# Patient Record
Sex: Female | Born: 1970 | Race: Black or African American | Hispanic: No | Marital: Single
Health system: Midwestern US, Community
[De-identification: ages and names within clinical notes are randomized; demographics above are authoritative.]

## PROBLEM LIST (undated history)

## (undated) DIAGNOSIS — E119 Type 2 diabetes mellitus without complications: Secondary | ICD-10-CM

## (undated) DIAGNOSIS — F319 Bipolar disorder, unspecified: Secondary | ICD-10-CM

## (undated) DIAGNOSIS — R7989 Other specified abnormal findings of blood chemistry: Secondary | ICD-10-CM

## (undated) DIAGNOSIS — E1165 Type 2 diabetes mellitus with hyperglycemia: Secondary | ICD-10-CM

## (undated) DIAGNOSIS — M25569 Pain in unspecified knee: Secondary | ICD-10-CM

## (undated) DIAGNOSIS — J209 Acute bronchitis, unspecified: Secondary | ICD-10-CM

## (undated) HISTORY — PX: BACK SURGERY: SHX140

## (undated) HISTORY — PX: LITHOTRIPSY: SUR834

---

## 2016-05-29 ENCOUNTER — Emergency Department: Payer: Medicaid Other

## 2016-05-29 ENCOUNTER — Emergency Department
Admission: EM | Admit: 2016-05-29 | Discharge: 2016-05-29 | Disposition: A | Discharge status: Home / Self Care | Payer: Medicaid Other | Attending: Emergency Medicine | Admitting: Emergency Medicine

## 2016-05-29 ENCOUNTER — Encounter: Payer: Self-pay | Admitting: *Deleted

## 2016-05-29 DIAGNOSIS — M25561 Pain in right knee: Secondary | ICD-10-CM | POA: Diagnosis not present

## 2016-05-29 DIAGNOSIS — E119 Type 2 diabetes mellitus without complications: Secondary | ICD-10-CM | POA: Insufficient documentation

## 2016-05-29 HISTORY — DX: Bipolar disorder, unspecified: F31.9

## 2016-05-29 HISTORY — DX: Type 2 diabetes mellitus without complications: E11.9

## 2016-05-29 MED ORDER — MELOXICAM 15 MG PO TABS: 15.0000 mg | ORAL_TABLET | Freq: Every day | ORAL | 0 refills | Status: AC

## 2016-05-29 MED ORDER — BACLOFEN 10 MG PO TABS: 10.0000 mg | ORAL_TABLET | Freq: Three times a day (TID) | ORAL | 0 refills | Status: AC

## 2016-05-29 MED ORDER — MELOXICAM 7.5 MG PO TABS
15.0000 mg | ORAL_TABLET | Freq: Once | ORAL | Status: AC
  Administered 2016-05-29: 15 mg via ORAL
  Filled 2016-05-29: qty 2

## 2016-05-29 MED ORDER — MELOXICAM 15 MG PO TABS: 15.0000 mg | ORAL_TABLET | Freq: Every day | ORAL | 0 refills | Status: DC

## 2016-05-29 MED ORDER — BACLOFEN 10 MG PO TABS
10.0000 mg | ORAL_TABLET | Freq: Three times a day (TID) | ORAL | 0 refills | Status: DC
Start: 2016-05-29 — End: 2016-05-29

## 2016-05-29 NOTE — ED Provider Notes (Signed)
[facility] Emergency Department Provider Note ____________________________________________  Time seen: Approximately 9:35 PM  I have reviewed the triage vital signs and the nursing notes.   HISTORY  Chief Complaint Knee Pain    HPI Yvonne Mccormick is a 46 y.o. female presents to the emergency department for evaluation of right knee pain. She states she has a history of 2 surgeries to the right knee. She states that while in church this morning, her knee began to hurt and she felt it "pop." She states that while walking home it continued to hurt and she felt like it got weak and was going to "give up on her." She has not taken any medications for her pain. She states that she went home and took a nap and when she woke up it was still hurting so she decided to call the ambulance to bring her to the hospital. She denies injury. She denies recent immobilization. She denies any chest pain or shortness of breath. She denies any pain in her right calf. She is not currently taking any hormones.She denies smoking.  Past Medical History:  Diagnosis Date  . Bipolar 1 disorder (HCC)   . Diabetes mellitus without complication (HCC)     There are no active problems to display for this patient.   Past Surgical History:  Procedure Laterality Date  . BACK SURGERY    . LITHOTRIPSY      Prior to Admission medications   Medication Sig Start Date End Date Taking? Authorizing Provider  baclofen (LIORESAL) 10 MG tablet Take 1 tablet (10 mg total) by mouth 3 (three) times daily. 05/29/16   Chinita Pester, FNP  meloxicam (MOBIC) 15 MG tablet Take 1 tablet (15 mg total) by mouth daily. 05/29/16   Chinita Pester, FNP    Allergies Lamotrigine and Paliperidone  No family history on file.  Social History Social History  Substance Use Topics  . Smoking status: Not on file  . Smokeless tobacco: Not on file  . Alcohol use Not on file    Review of Systems Constitutional: No  recent illness. Cardiovascular: Denies chest pain or palpitations. Respiratory: Denies shortness of breath. Musculoskeletal: Pain in Right knee Skin: Negative for rash, wound, lesion. Neurological: Negative for focal weakness or numbness.  ____________________________________________   PHYSICAL EXAM:  VITAL SIGNS: ED Triage Vitals  Enc Vitals Group     BP 05/29/16 2053 129/70     Pulse Rate 05/29/16 2052 98     Resp 05/29/16 2052 18     Temp 05/29/16 2052 98.3 F (36.8 C)     Temp Source 05/29/16 2052 Axillary     SpO2 05/29/16 2052 96 %     Weight 05/29/16 2050 246 lb (111.6 kg)     Height 05/29/16 2050  (1.6 m)     Head Circumference --      Peak Flow --      Pain Score 05/29/16 2050 9     Pain Loc --      Pain Edu? --      Excl. in GC? --     Constitutional: Alert and oriented. Well appearing and in no acute distress. Eyes: Conjunctivae are normal. EOMI. Head: Atraumatic. Neck: No stridor.  Respiratory: Normal respiratory effort.   Musculoskeletal: Right knee without any evidence of acute abnormality. Tenderness is worse on the medial aspect. There is no swelling or erythema. Patient noted to be actively moving the knee when describing the pain, but when requested  to move the knee states it is too painful. Neurologic:  Normal speech and language. No gross focal neurologic deficits are appreciated. Speech is normal. No gait instability. Skin:  Skin is warm, dry and intact. Atraumatic. Psychiatric: Mood and affect are normal. Speech and behavior are normal.  ____________________________________________   LABS (all labs ordered are listed, but only abnormal results are displayed)  Labs Reviewed - No data to display ____________________________________________  RADIOLOGY  Right knee negative for acute abnormality per radiology. I, Kem Boroughs, personally viewed and evaluated these images (plain radiographs) as part of my medical decision making, as well as  reviewing the written report by the radiologist. ___________________________________________   PROCEDURES  Procedure(s) performed: Knee immobilizer applied to the right knee about are in. Patient neurovascularly intact post-application.  ____________________________________________   INITIAL IMPRESSION / ASSESSMENT AND PLAN / ED COURSE  46 year old female presenting to the emergency department for evaluation of right knee pain. Exam and x-ray are both consistent. No acute abnormality is noted. Patient was instructed to follow-up with orthopedics for symptoms that are not improving over the next few days. She was advised to wear the knee immobilizer when out of bed as long as the knee is painful or until she is advised to do otherwise by orthopedics.   Pertinent labs & imaging results that were available during my care of the patient were reviewed by me and considered in my medical decision making (see chart for details).  _________________________________________   FINAL CLINICAL IMPRESSION(S) / ED DIAGNOSES  Final diagnoses:  Acute pain of right knee    Discharge Medication List as of 05/29/2016 10:19 PM    START taking these medications   Details  baclofen (LIORESAL) 10 MG tablet Take 1 tablet (10 mg total) by mouth 3 (three) times daily., Starting Sun 05/29/2016, Print    meloxicam (MOBIC) 15 MG tablet Take 1 tablet (15 mg total) by mouth daily., Starting Sun 05/29/2016, Print        If controlled substance prescribed during this visit, 12 month history viewed on the [facility] prior to issuing an initial prescription for Schedule II or III opiod.    Chinita Pester, FNP 05/29/16 2312    Sharyn Creamer, MD 05/29/16 2356

## 2016-05-29 NOTE — Discharge Instructions (Signed)
Follow up with primary care or orthopedics. Use the knee immobilizer when you are out of bed. Take the medications as prescribed.

## 2016-05-29 NOTE — ED Notes (Signed)
Heading to flex wait area to get this patient for treatment room when I hear someone on the other side of the door shouting curse words; I enter the waiting area and pt is on her cell phone shouting at someone; there are several other people in the waiting area and pt is asked to please refrain from such language; pt got angry and said she had already apologized to them and "you need to go on"; pt became pleasant and apologetic when she realized she was going to a treatment room; assisted to treatment room 43 and onto stretcher without incident

## 2016-05-29 NOTE — ED Triage Notes (Signed)
Pt arrives via EMS from home. Pt states she woke up from a nap today and has had right knee pain, states she is unable to stand up on the leg. She denies any injury. Has had orthoscopic surgery on the knee previously. No deformity or swelling noted.

## 2018-10-06 IMAGING — CR DG KNEE COMPLETE 4+V*R*
1 series · 4 of 4 positions shown · non-contrast
Comparison: None.

CLINICAL DATA: Patient woke up today with right knee pain and
unable to stand. No injury. Previous right knee surgery.

EXAM:
RIGHT KNEE - COMPLETE 4+ VIEW

[Series 1: dg knee complete 4 views right · 0.14mm/px · 4 of 4 slices shown]
[im 1/4]
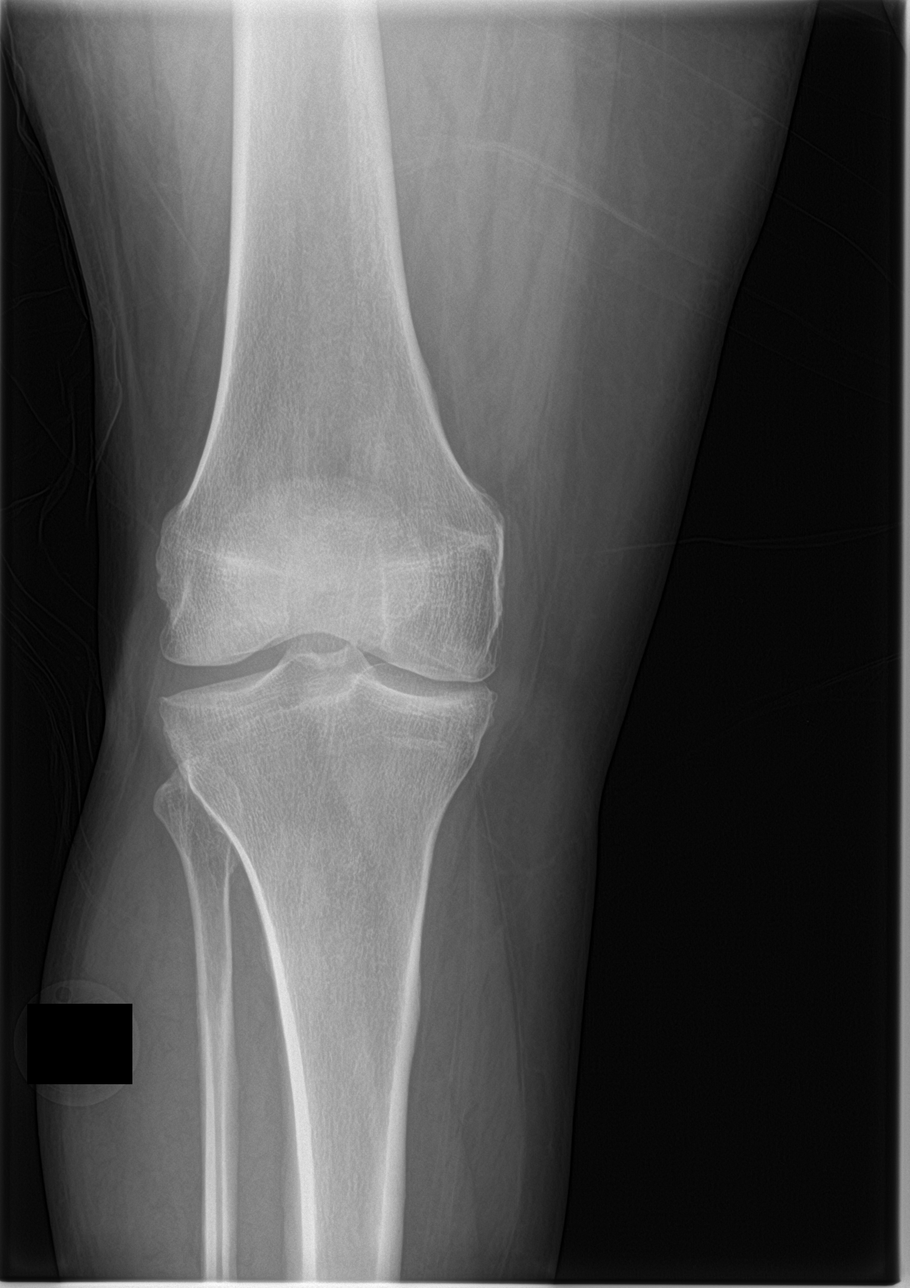
[im 2/4]
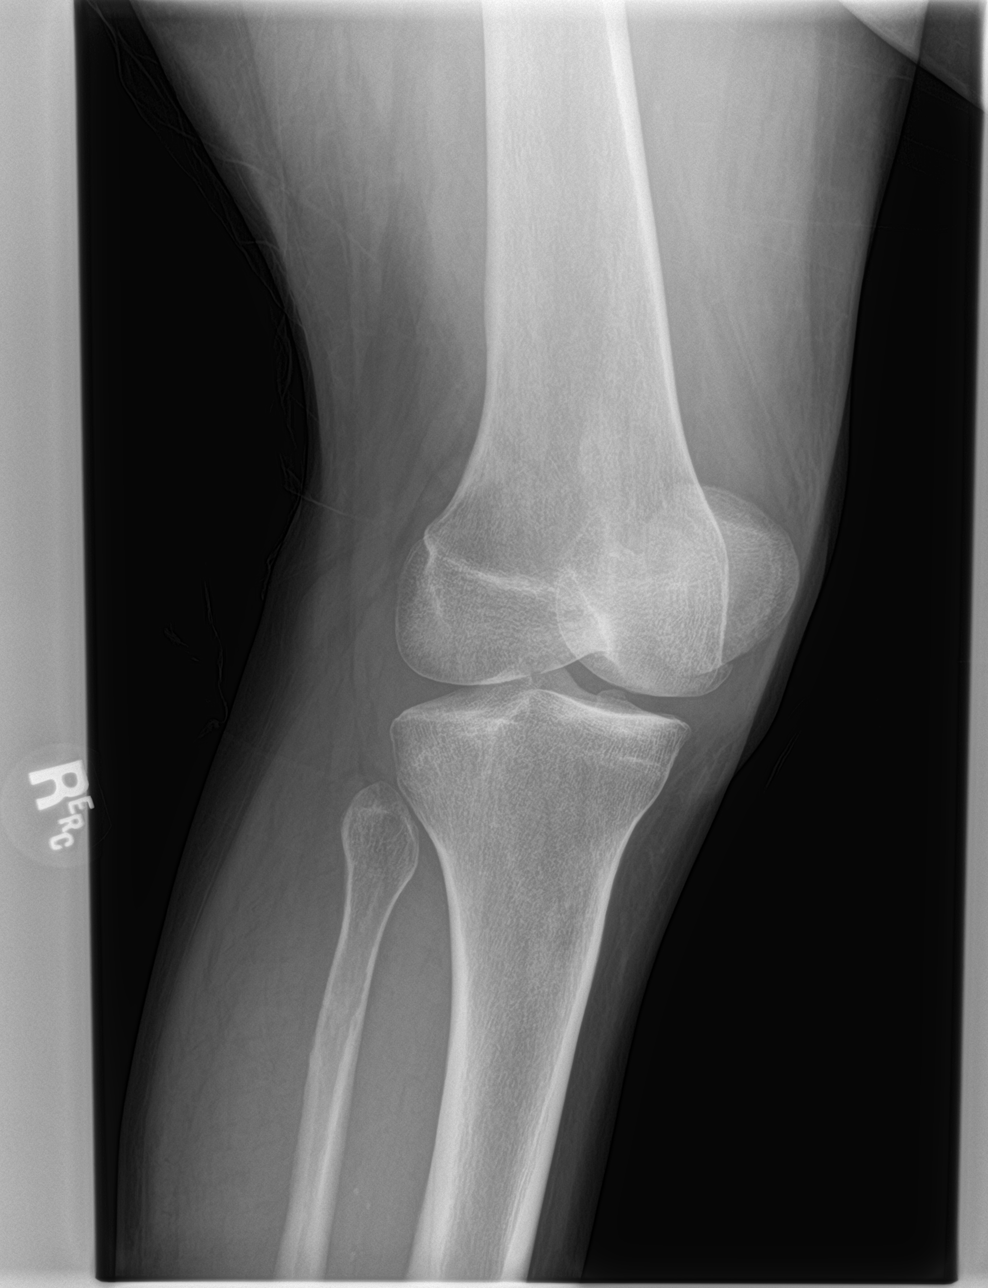
[im 3/4]
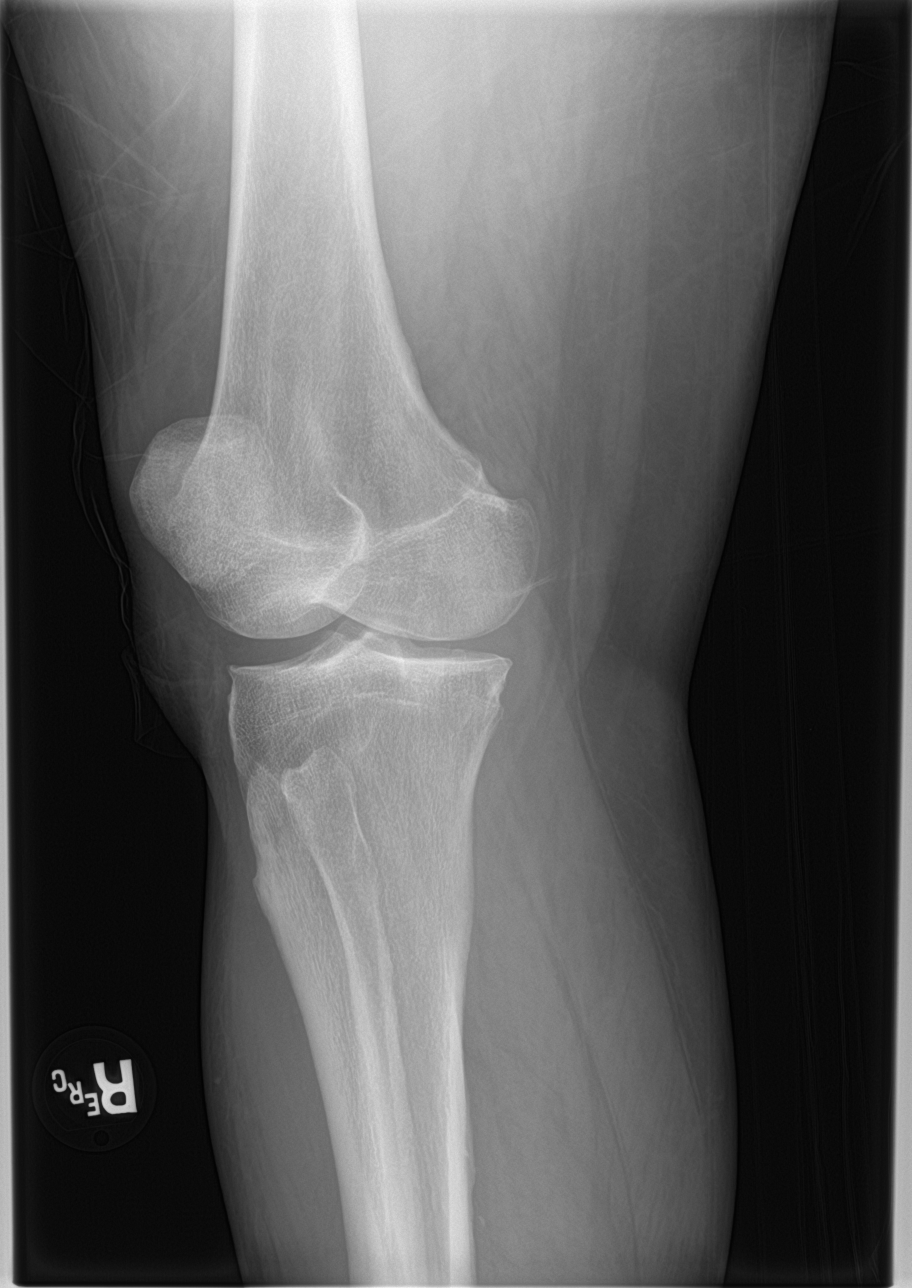
[im 4/4]
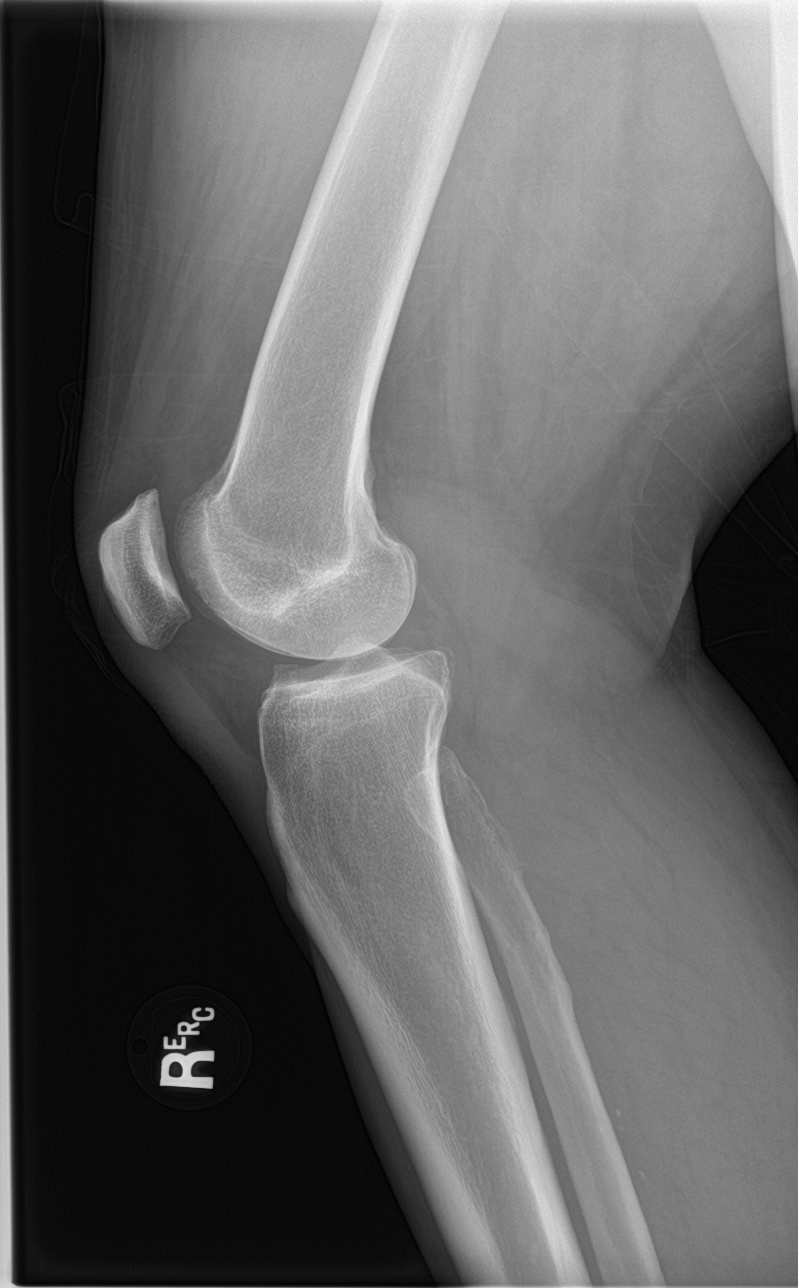

[4 of 4 positions shown; findings below may reference images not displayed]

FINDINGS: Minimal degenerative changes over the medial compartment. No acute
fracture or dislocation. No significant joint effusion.
IMPRESSION: No acute findings.

## 2019-11-06 ENCOUNTER — Ambulatory Visit: Attending: Family | Primary: Family Medicine

## 2020-02-19 ENCOUNTER — Encounter: Payer: PRIVATE HEALTH INSURANCE | Attending: Family Medicine | Primary: Family Medicine

## 2020-02-25 ENCOUNTER — Ambulatory Visit
Admit: 2020-02-25 | Discharge: 2020-02-25 | Payer: PRIVATE HEALTH INSURANCE | Attending: Family Medicine | Primary: Family Medicine

## 2020-02-25 ENCOUNTER — Ambulatory Visit: Attending: Family Medicine | Primary: Family Medicine

## 2020-02-25 DIAGNOSIS — Z0000 Encounter for general adult medical examination without abnormal findings: Secondary | ICD-10-CM

## 2020-02-25 MED ORDER — MELOXICAM 15 MG TAB
15 mg | ORAL_TABLET | Freq: Every day | ORAL | 1 refills | Status: AC
Start: 2020-02-25 — End: ?

## 2020-02-25 MED ORDER — LISINOPRIL 10 MG TAB
10 mg | ORAL_TABLET | Freq: Every day | ORAL | 3 refills | Status: AC
Start: 2020-02-25 — End: ?

## 2020-02-25 MED ORDER — GLIPIZIDE 10 MG TAB
10 mg | ORAL_TABLET | Freq: Every day | ORAL | 1 refills | Status: DC
Start: 2020-02-25 — End: 2020-06-01

## 2020-02-25 MED ORDER — METFORMIN 500 MG TAB
500 mg | ORAL_TABLET | Freq: Two times a day (BID) | ORAL | 0 refills | Status: AC
Start: 2020-02-25 — End: 2020-05-25

## 2020-02-25 NOTE — Progress Notes (Signed)
Chief Complaint   Patient presents with   ??? Complete Physical     she is a 50 y.o. year old female who presents for CPE  Complete Physical Exam Questions:    LMP -  02/05/2020  Last Pap (q 3 years, or q5 with HPV) - 12/2019  Last Mammogram (50-74 biennially)- 11/21  Hx of abnl Pap - No    1.  Do you follow a low fat diet?  no  2.  Are you up to date on your Tdap (<10 years)?  Unknown  3.  Have you ever had a Pneumovax vaccine (>65)?  Not applicable   KYH06 Not applicable   CBJS28 Not applicable  4.  Have you had Zoster vaccine (>60)? Not applicable  5.  Have you had the HPV - Gardasil (13- 26)? Not applicable  6.  Do you follow an exercise program?  no  7.  Do you smoke?  yes  8.  Do you consider yourself overweight?  yes  9.  Is there a family history of CAD< age 66? No  10.  Is there a family history of Cancer?  yes  11.  Do you know your Cancer risks?  Yes  12.  Have you had a colonoscopy?  no  13. Have you been tested for HIV or other STI's? Yes HIV testing today(18-65 y/o)?Yes  14.  Have had a bone density scan or DEXA done(>65)?Not applicable  15.  Have you had an EKG performed in the last five years (>50)?  No    Other complaints:    Reviewed and agree with Nurse Note and duplicated in this note.  Reviewed PmHx, RxHx, FmHx, SocHx, AllgHx and updated and dated in the chart.    No family history on file.    Past Medical History:   Diagnosis Date   ??? Diabetes [facility])       Social History     Socioeconomic History   ??? Marital status: SINGLE        Review of Systems - negative except as listed above      Objective:     Vitals:    02/25/20 1609   BP: 120/79   Pulse: 98   Resp: 16   Temp: 98.4 ??F (36.9 ??C)   SpO2: 96%   Weight: 260 lb (117.9 kg)   Height: 5\' 3"  (1.6 m)       Physical Examination: General appearance - alert, well appearing, and in no distress  Eyes - pupils equal and reactive, extraocular eye movements intact  Ears - bilateral TM's and external ear canals normal  Nose - normal and patent, no erythema,  discharge or polyps  Mouth - mucous membranes moist, pharynx normal without lesions  Neck - supple, no significant adenopathy  Chest - clear to auscultation, no wheezes, rales or rhonchi, symmetric air entry  Heart - normal rate, regular rhythm, normal S1, S2, no murmurs, rubs, clicks or gallops  Abdomen - soft, nontender, nondistended, no masses or organomegaly  Neurological - alert, oriented, normal speech, no focal findings or movement disorder noted  Musculoskeletal - no joint tenderness, deformity or swelling  Extremities - peripheral pulses normal, no pedal edema, no clubbing or cyanosis  Skin - normal coloration and turgor, no rashes, no suspicious skin lesions noted      Assessment/ Plan:   Diagnoses and all orders for this visit:    1. Well woman exam (no gynecological exam)  -     CBC  W/O DIFF; Future  -     METABOLIC PANEL, COMPREHENSIVE; Future  -     LIPID PANEL; Future    2. Need for hepatitis C screening test  -     HEPATITIS C AB; Future    3. Possible exposure to STD  -     HSV 1 AND 2-SPEC AB, IGG W/RFX TO SUPPL HSV-2 TESTING; Future  -     HIV 1/2 AG/AB, 4TH GENERATION,W RFLX CONFIRM; Future  -     RPR; Future  -     CHLAMYDIA / GC-AMPLIFIED; Future    4. Controlled type 2 diabetes mellitus with complication, without long-term current use of insulin (HCC)  -     HEMOGLOBIN A1C WITH EAG; Future  -     MICROALBUMIN, UR, RAND W/ MICROALB/CREAT RATIO; Future  -     HM DIABETES FOOT EXAM    5. Colon cancer screening  -     REFERRAL TO GASTROENTEROLOGY    6. Dietary counseling and surveillance  -     REFERRAL TO WEIGHT LOSS    7. BMI 45.0-49.9, adult (HCC)  -     REFERRAL TO WEIGHT LOSS           Labs to be drawn: CBC, CMP, Lipid            I have discussed the diagnosis with the patient and the intended plan as seen in the above orders.  The patient has received an after-visit summary and questions were answered concerning future plans.   Medication Side Effects and Warnings were discussed with  patient,  Patient Labs were reviewed and or requested, and  Patient Past Records were reviewed and or requested  yes         Chief Complaint   Patient presents with   ??? Complete Physical     she is a 50 y.o. year old female who presents for follow-up of DM and HTN    Diabetes - Pt well controlled on Metformin glipizide.      is not checking BS at home.  denies hypoglycemia symptoms.  denies neuropathy symptoms.  Last eye exam none.  Last foot exam none.    Questionaire:  Diabetes Report Card   1) Have you seen the eye doctor in past year?no    2) How would you  rate your Diabetic Diet?  Good   3) How well do you take care of your feet?  Well   4) Do you keep your Primary Care Follow Up Appts?no    5) Do you know your A1C goal?yes    6) Do you take your medications daily?yes    7) Do you check your blood sugars?no    8) Have you gained weight?yes    9) Have you lost weight?no    10) Do you follow an exercise program?no    11) Can you do better?yes        Patient would also like referral to be seen by weight loss clinic.  Patient has not tried any weight loss medications or formalized therapy or nutritional .    No results found for: HBA1C, HBA1CEXT  No results found for: CHOL, CHOLX, CHLST, CHOLV, HDL, HDLP, LDL, LDLC, DLDLP, TGLX, TRIGL, TRIGP, CHHD, CHHDX  No results found for: MCACR, MCA1, MCA2, MCA3, MCAU      Reviewed and agree with Nurse Note and duplicated in this note.  Reviewed PmHx, RxHx, FmHx, SocHx, AllgHx and updated and dated  in the chart.    History reviewed. No pertinent family history.    Past Medical History:   Diagnosis Date   ??? Diabetes [facility])       Social History     Socioeconomic History   ??? Marital status: SINGLE   Tobacco Use   ??? Smoking status: Current Some Day Smoker   ??? Smokeless tobacco: Never Used   Substance and Sexual Activity   ??? Alcohol use: Not Currently   ??? Drug use: Not Currently   ??? Sexual activity: Not Currently        Review of Systems - negative except as listed  above      Objective:     Vitals:    02/25/20 1609   BP: 120/79   Pulse: 98   Resp: 16   Temp: 98.4 ??F (36.9 ??C)   SpO2: 96%   Weight: 260 lb (117.9 kg)   Height: 5\' 3"  (1.6 m)       Physical Examination: General appearance - alert, well appearing, and in no distress  Eyes - pupils equal and reactive, extraocular eye movements intact  Ears - bilateral TM's and external ear canals normal  Nose - normal and patent, no erythema, discharge or polyps  Mouth - mucous membranes moist, pharynx normal without lesions  Neck - supple, no significant adenopathy  Chest - clear to auscultation, no wheezes, rales or rhonchi, symmetric air entry  Heart - normal rate, regular rhythm, normal S1, S2, no murmurs, rubs, clicks or gallops  Abdomen - soft, nontender, nondistended, no masses or organomegaly  Musculoskeletal - no joint tenderness, deformity or swelling  Extremities - peripheral pulses normal, no pedal edema, no clubbing or cyanosis  Skin - normal coloration and turgor, no rashes, no suspicious skin lesions noted     Assessment/ Plan:   Diagnoses and all orders for this visit:    1. Well woman exam (no gynecological exam)  -     CBC W/O DIFF; Future  -     METABOLIC PANEL, COMPREHENSIVE; Future  -     LIPID PANEL; Future    2. Need for hepatitis C screening test  -     HEPATITIS C AB; Future    3. Possible exposure to STD  -     HSV 1 AND 2-SPEC AB, IGG W/RFX TO SUPPL HSV-2 TESTING; Future  -     HIV 1/2 AG/AB, 4TH GENERATION,W RFLX CONFIRM; Future  -     RPR; Future  -     CHLAMYDIA / GC-AMPLIFIED; Future    4. Controlled type 2 diabetes mellitus with complication, without long-term current use of insulin (HCC)  -     HEMOGLOBIN A1C WITH EAG; Future  -     MICROALBUMIN, UR, RAND W/ MICROALB/CREAT RATIO; Future  -     HM DIABETES FOOT EXAM    5. Colon cancer screening  -     REFERRAL TO GASTROENTEROLOGY    6. Dietary counseling and surveillance  -     REFERRAL TO WEIGHT LOSS    7. BMI 45.0-49.9, adult (HCC)  -     REFERRAL  TO WEIGHT LOSS    Other orders  -     lisinopriL (PRINIVIL, ZESTRIL) 10 mg tablet; Take 1 Tablet by mouth daily.  -     meloxicam (MOBIC) 15 mg tablet; Take 1 Tablet by mouth daily.  -     metFORMIN (GLUCOPHAGE) 500 mg tablet; Take 1 Tablet by mouth  two (2) times daily (with meals) for 90 days.  -     glipiZIDE (GLUCOTROL) 10 mg tablet; Take 1 Tablet by mouth daily.           .       I have discussed the diagnosis with the patient and the intended plan as seen in the above orders.  The patient has received an after-visit summary and questions were answered concerning future plans.     Medication Side Effects and Warnings were discussed with patient,  Patient Labs were reviewed and or requested, and  Patient Past Records were reviewed and or requested  yes         Pt agrees to call or return to clinic and/or go to closest ER with any worsening of symptoms.  This may include, but not limited to increased fever (>100.4) with NSAIDS or Tylenol, increased edema, confusion, rash, worsening of presenting symptoms.    Please note that this dictation was completed with Dragon, the computer voice recognition software.  Quite often unanticipated grammatical, syntax, homophones, and other interpretive errors are inadvertently transcribed by the computer software.  Please disregard these errors.  Please excuse any errors that have escaped final proofreading.  Thank you.

## 2020-02-25 NOTE — Addendum Note (Signed)
Addendum  Note by Kennyth Arnold at 02/25/20 1515                Author: Kennyth Arnold  Service: --  Author Type: Medical Assistant       Filed: 08/21/20 1344  Encounter Date: 02/25/2020  Status: Signed          Editor: Kennyth Arnold (Medical Assistant)          Addended by: Kennyth Arnold on: 08/21/2020 01:44 PM    Modules accepted: Orders

## 2020-02-25 NOTE — Progress Notes (Signed)
Sugars are very high, and under control.  Recommend starting insulin.  If you are open to this I can send this to your pharmacy. Your kidney function is elevated likely due to uncontrolled sugars.  We will send you to a kidney specialist for second opinion.

## 2020-02-25 NOTE — Progress Notes (Signed)
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  This usually presents as ulcers

## 2020-02-25 NOTE — Addendum Note (Signed)
Addendum  Note by Lockie Pares, LPN at 59/56/38 1515                Author: Lockie Pares, LPN  Service: --  Author Type: Licensed Nurse       Filed: 02/25/20 1716  Encounter Date: 02/25/2020  Status: Signed          Editor: Lockie Pares, LPN (Licensed Nurse)          Addended by: Lockie Pares on: 02/25/2020 05:16 PM    Modules accepted: Orders

## 2020-02-25 NOTE — Addendum Note (Signed)
Addendum  Note by Betsey Amen R at 02/25/20 1515                Author: Betsey Amen R  Service: --  Author Type: Technician       Filed: 08/21/20 0928  Encounter Date: 02/25/2020  Status: Signed          Editor: Clyda Greener (Technician)          Addended by: Clyda Greener on: 08/21/2020 09:28 AM    Modules accepted: Orders, SmartSet

## 2020-02-26 ENCOUNTER — Encounter

## 2020-02-26 LAB — LIPID PANEL
CHOL/HDL Ratio: 3.8 (ref 0.0–5.0)
Chol/HDL Ratio: 3.8 (ref 0.0–5.0)
Cholesterol, Total: 169 MG/DL (ref <200)
Cholesterol, total: 169 MG/DL (ref <200)
HDL Cholesterol: 45 MG/DL
HDL: 45 MG/DL
LDL Calculated: 79.6 MG/DL (ref 0–100)
LDL, calculated: 79.6 MG/DL (ref 0–100)
Triglyceride: 222 MG/DL — ABNORMAL HIGH (ref <150)
Triglycerides: 222 MG/DL — ABNORMAL HIGH (ref <150)
VLDL Cholesterol Calculated: 44.4 MG/DL
VLDL, calculated: 44.4 MG/DL

## 2020-02-26 LAB — CBC
Hematocrit: 36.1 % (ref 35.0–47.0)
Hemoglobin: 11.6 g/dL (ref 11.5–16.0)
MCH: 28.9 PG (ref 26.0–34.0)
MCHC: 32.1 g/dL (ref 30.0–36.5)
MCV: 89.8 FL (ref 80.0–99.0)
MPV: 10.3 FL (ref 8.9–12.9)
NRBC Absolute: 0 10*3/uL (ref 0.00–0.01)
Nucleated RBCs: 0 PER 100 WBC
Platelets: 278 10*3/uL (ref 150–400)
RBC: 4.02 M/uL (ref 3.80–5.20)
RDW: 11.5 % (ref 11.5–14.5)
WBC: 8.9 10*3/uL (ref 3.6–11.0)

## 2020-02-26 LAB — HEMOGLOBIN A1C W/EAG
Hemoglobin A1C: 11.2 % — ABNORMAL HIGH (ref 4.0–5.6)
eAG: 275 mg/dL

## 2020-02-26 LAB — HEPATITIS C ANTIBODY: Hepatitis C Ab: NONREACTIVE

## 2020-02-26 LAB — COMPREHENSIVE METABOLIC PANEL
ALT: 23 U/L (ref 12–78)
AST: 12 U/L — ABNORMAL LOW (ref 15–37)
Albumin/Globulin Ratio: 1 — ABNORMAL LOW (ref 1.1–2.2)
Albumin: 3.3 g/dL — ABNORMAL LOW (ref 3.5–5.0)
Alkaline Phosphatase: 75 U/L (ref 45–117)
Anion Gap: 7 mmol/L (ref 5–15)
BUN: 30 MG/DL — ABNORMAL HIGH (ref 6–20)
Bun/Cre Ratio: 20 (ref 12–20)
CO2: 25 mmol/L (ref 21–32)
Calcium: 9.1 MG/DL (ref 8.5–10.1)
Chloride: 101 mmol/L (ref 97–108)
Creatinine: 1.53 MG/DL — ABNORMAL HIGH (ref 0.55–1.02)
EGFR IF NonAfrican American: 36 mL/min/{1.73_m2} — ABNORMAL LOW (ref >60)
GFR African American: 44 mL/min/{1.73_m2} — ABNORMAL LOW (ref >60)
Globulin: 3.3 g/dL (ref 2.0–4.0)
Glucose: 312 mg/dL — ABNORMAL HIGH (ref 65–100)
Potassium: 4.3 mmol/L (ref 3.5–5.1)
Sodium: 133 mmol/L — ABNORMAL LOW (ref 136–145)
Total Bilirubin: 0.2 MG/DL (ref 0.2–1.0)
Total Protein: 6.6 g/dL (ref 6.4–8.2)

## 2020-02-26 LAB — HIV 1/2 ANTIGEN/ANTIBODY, FOURTH GENERATION W/RFL: Interpretation: NONREACTIVE

## 2020-02-26 LAB — MICROALBUMIN / CREATININE URINE RATIO
Creatinine, Ur: 77.6 mg/dL
Microalb, Ur: 2.33 MG/DL
Microalbumin Creatinine Ratio: 30 mg/g (ref 0–30)

## 2020-02-26 LAB — RPR
RPR: NONREACTIVE
RPR: NONREACTIVE

## 2020-02-26 LAB — CBC W/O DIFF
ABSOLUTE NRBC: 0 10*3/uL (ref 0.00–0.01)
HCT: 36.1 % (ref 35.0–47.0)
HGB: 11.6 g/dL (ref 11.5–16.0)
MCH: 28.9 PG (ref 26.0–34.0)
MCHC: 32.1 g/dL (ref 30.0–36.5)
MCV: 89.8 FL (ref 80.0–99.0)
MPV: 10.3 FL (ref 8.9–12.9)
NRBC: 0 PER 100 WBC
PLATELET: 278 10*3/uL (ref 150–400)
RBC: 4.02 M/uL (ref 3.80–5.20)
RDW: 11.5 % (ref 11.5–14.5)
WBC: 8.9 10*3/uL (ref 3.6–11.0)

## 2020-02-26 LAB — METABOLIC PANEL, COMPREHENSIVE
A-G Ratio: 1 — ABNORMAL LOW (ref 1.1–2.2)
ALT (SGPT): 23 U/L (ref 12–78)
AST (SGOT): 12 U/L — ABNORMAL LOW (ref 15–37)
Albumin: 3.3 g/dL — ABNORMAL LOW (ref 3.5–5.0)
Alk. phosphatase: 75 U/L (ref 45–117)
Anion gap: 7 mmol/L (ref 5–15)
BUN/Creatinine ratio: 20 (ref 12–20)
BUN: 30 MG/DL — ABNORMAL HIGH (ref 6–20)
Bilirubin, total: 0.2 MG/DL (ref 0.2–1.0)
CO2: 25 mmol/L (ref 21–32)
Calcium: 9.1 MG/DL (ref 8.5–10.1)
Chloride: 101 mmol/L (ref 97–108)
Creatinine: 1.53 MG/DL — ABNORMAL HIGH (ref 0.55–1.02)
GFR est AA: 44 mL/min/{1.73_m2} — ABNORMAL LOW (ref >60)
GFR est non-AA: 36 mL/min/{1.73_m2} — ABNORMAL LOW (ref >60)
Globulin: 3.3 g/dL (ref 2.0–4.0)
Glucose: 312 mg/dL — ABNORMAL HIGH (ref 65–100)
Potassium: 4.3 mmol/L (ref 3.5–5.1)
Protein, total: 6.6 g/dL (ref 6.4–8.2)
Sodium: 133 mmol/L — ABNORMAL LOW (ref 136–145)

## 2020-02-26 LAB — HEPATITIS C AB: Hep C virus Ab Interp.: NONREACTIVE

## 2020-02-26 LAB — HIV 1/2 AG/AB, 4TH GENERATION,W RFLX CONFIRM: HIV 1/2 Interpretation: NONREACTIVE

## 2020-02-26 LAB — HEMOGLOBIN A1C WITH EAG
Est. average glucose: 275 mg/dL
Hemoglobin A1c: 11.2 % — ABNORMAL HIGH (ref 4.0–5.6)

## 2020-02-26 LAB — MICROALBUMIN, UR, RAND W/ MICROALB/CREAT RATIO
Creatinine, urine random: 77.6 mg/dL
Microalbumin,urine random: 2.33 MG/DL
Microalbumin/Creat ratio (mg/g creat): 30 mg/g (ref 0–30)

## 2020-02-26 NOTE — Progress Notes (Signed)
ref

## 2020-02-27 LAB — CHLAMYDIA / GC-AMPLIFIED
CHLAMYDIA TRACHOMATIS, NAA, 188078: NEGATIVE
Chlamydia trachomatis, NAA: NEGATIVE
NEISSERIA GONORRHOEAE, NAA, 188086: NEGATIVE
Neisseria gonorrhoeae, NAA: NEGATIVE

## 2020-02-28 LAB — HSV 1 AND 2-SPEC AB, IGG W/RFX TO SUPPL HSV-2 TESTING
HSV 1 Ab, IgG, type spec.: 56.3 index — ABNORMAL HIGH (ref 0.00–0.90)
HSV 1 IGG,TYPE SPEC, 164899: 56.3 index — ABNORMAL HIGH (ref 0.00–0.90)
HSV 2 Ab IgG, type spec.: 7.42 index — ABNORMAL HIGH (ref 0.00–0.90)
HSV 2 IGG, TYPE SPEC, 163153: 7.42 index — ABNORMAL HIGH (ref 0.00–0.90)

## 2020-04-29 ENCOUNTER — Encounter: Primary: Family Medicine

## 2020-06-01 ENCOUNTER — Ambulatory Visit
Admit: 2020-06-01 | Discharge: 2020-06-01 | Payer: PRIVATE HEALTH INSURANCE | Attending: "Endocrinology | Primary: Family Medicine

## 2020-06-01 ENCOUNTER — Ambulatory Visit: Attending: "Endocrinology | Primary: Family Medicine

## 2020-06-01 DIAGNOSIS — E1165 Type 2 diabetes mellitus with hyperglycemia: Secondary | ICD-10-CM

## 2020-06-01 MED ORDER — TRULICITY 1.5 MG/0.5 ML SUBCUTANEOUS PEN INJECTOR
1.5 mg/0.5 mL | SUBCUTANEOUS | 0 refills | Status: AC
Start: 2020-06-01 — End: ?

## 2020-06-01 MED ORDER — GLIPIZIDE SR 10 MG 24 HR TAB
10 mg | ORAL_TABLET | ORAL | 5 refills | Status: AC
Start: 2020-06-01 — End: ?

## 2020-06-01 MED ORDER — EMPAGLIFLOZIN 25 MG TABLET
25 mg | ORAL_TABLET | ORAL | 0 refills | Status: AC
Start: 2020-06-01 — End: ?

## 2020-06-01 MED ORDER — BLOOD GLUCOSE TEST STRIPS
ORAL_STRIP | 5 refills | Status: AC
Start: 2020-06-01 — End: ?

## 2020-06-01 MED ORDER — TRULICITY 3 MG/0.5 ML SUBCUTANEOUS PEN INJECTOR
3 mg/0.5 mL | SUBCUTANEOUS | 5 refills | Status: AC
Start: 2020-06-01 — End: ?

## 2020-06-01 MED ORDER — METFORMIN 500 MG TAB
500 mg | ORAL_TABLET | ORAL | 0 refills | Status: AC
Start: 2020-06-01 — End: ?

## 2020-06-01 MED ORDER — TRULICITY 0.75 MG/0.5 ML SUBCUTANEOUS PEN INJECTOR
0.75 mg/0.5 mL | PEN_INJECTOR | SUBCUTANEOUS | 0 refills | Status: AC
Start: 2020-06-01 — End: ?

## 2020-06-01 NOTE — Progress Notes (Signed)
Chief Complaint   Patient presents with   ??? Diabetes       Patient was last seen: New Patient Visit     General:   DM since 2003  Was on metformin  Then insulin  Drug treatment program - was not allowed to use shots  Then back on metformin    On glipizide long term stopped again drug treatment program  Left program in May   On jardiance -- just started   PCP last week put back on glipizide   No TZD, no GLP1    medicade    A1c: last a1c was 11.2  (jan 22)    DM Medications:      Key Antihyperglycemic Medications             glipiZIDE SR (GLUCOTROL XL) 10 mg CR tablet (Taking) 2 tablets once a day    dulaglutide (Trulicity) 3 mg/0.5 mL pnij (Taking) 3 mg by SubCUTAneous route every seven (7) days.          Last Changes: : no recent changes    Sugar Checks: does not have meter     AM: does not check sugars     PM: does not check sugars     LOWs:  denies low sugars     DIET: has received diabetic meal training wants to get healthier     EXERCISE: does not exercise  - -walks a lot at work    HTN: at goal, on ACE-I, HTN followed by PCP     LIPIDS: at goal, on no meds, lipids followed by PCP    RENAL: has normal renal function, has normal UMA-r , in the past year, is on an ACE-I     EYES: eye exam in past year, has retinopathy     FEET: has no current issues, no numbness or tingling     DENTAL:  overdue for dentist     HEART:  no chest pain, shortness of breath or claudication, has no cardiac history     ASA:  does not take aspirin or other blood thinner     SYMPTOMS: no polyuria, thirst or blurred vision     THYROID: no known thyroid issue    DIABETES HISTORY: see above      LABS/STUDIES:     Lab Results   Component Value Date/Time    Hemoglobin A1c 11.2 (H) 02/25/2020 05:14 PM    Glucose 312 (H) 02/25/2020 05:14 PM    GFR est AA 44 (L) 02/25/2020 05:14 PM    GFR est non-AA 36 (L) 02/25/2020 05:14 PM    Microalbumin/Creat ratio (mg/g creat) 30 02/25/2020 05:14 PM    Microalbumin,urine random 2.33 02/25/2020 05:14 PM     Creatinine 1.53 (H) 02/25/2020 05:14 PM        Lab Results   Component Value Date/Time    Cholesterol, total 169 02/25/2020 05:14 PM    Triglyceride 222 (H) 02/25/2020 05:14 PM    HDL Cholesterol 45 02/25/2020 05:14 PM    LDL, calculated 79.6 02/25/2020 05:14 PM            Past Medical History:   Diagnosis Date   ??? Diabetes (HCC)         No past surgical history on file.     Social History     Tobacco Use   ??? Smoking status: Current Some Day Smoker   ??? Smokeless tobacco: Never Used   Substance Use Topics   ??? Alcohol  use: Not Currently      Employer:  Not Employed     No family history on file.     There were no vitals taken for this visit.     Weight Metrics 02/25/2020   Weight 260 lb   BMI 46.06 kg/m2        EXAM  - GENERAL: NCAT, Appears well nourished   - EYES: EOMI, non-icteric, no proptosis   - Ear/Nose/Throat: NCAT, no visible inflammation or masses   - CARDIOVASCULAR: no cyanosis, no visible JVD   - RESPIRATORY: respiratory effort normal without any distress or labored breathing   - MUSCULOSKELETAL: Normal ROM of neck and upper extremities observed   - SKIN: No rash on face  - NEUROLOGIC:  No facial asymmetry (Cranial nerve 7 motor function), No gaze palsy   - PSYCHIATRIC: Normal affect, Normal insight and judgement      Assessment/Plan:   1. Type 2 diabetes mellitus with hyperglycemia, without long-term current use of insulin (HCC)  Start Trulicity 0.10mcg once a week for 2 weeks    If tolerated, increase to 1.5mg  once a week for 2 weeks  (samples of both dosesgiven)    If doing well can then increase to 3.0mg  once a week (written Rx given).    There is also a 4.5mg  dose    Start Sample Libre2 continuous glucose monitor to monitor sugars for 2 weeks.  This will not be covered by insurance but you can purchase out of pocket if you like it    Regular Glucometer given that you can use to check your sugars.  Test strips sent     Continue Jardiance and Metformin at current dose (consider combining but oddly  Medicaid does not cover synjardy XR)    Increase glipizide 10mg  XL to 2 per day    Referral made to [facility]    2. Primary hypertension  One ACE-I per PCP    3. Type 2 diabetes mellitus with retinopathy without macular edema, without long-term current use of insulin, unspecified laterality, unspecified retinopathy severity (HCC)  Per Optha      Orders Placed This Encounter   ??? REFERRAL TO DIABETIC EDUCATION     Referral Priority:   Routine     Referral Type:   Consultation     Referral Reason:   Specialty Services Required     Number of Visits Requested:   1   ??? glipiZIDE SR (GLUCOTROL XL) 10 mg CR tablet     Sig: 2 tablets once a day     Dispense:  60 Tablet     Refill:  5   ??? dulaglutide (Trulicity) 3 mg/0.5 mL pnij     Sig: 3 mg by SubCUTAneous route every seven (7) days.     Dispense:  4 Each     Refill:  5   ??? glucose blood VI test strips (blood glucose test) strip     Sig: One touch verio.  Check up to 3 times a day     Dispense:  100 Strip     Refill:  5      Follow-up and Dispositions    ?? Return in about 6 weeks (around 07/13/2020).

## 2020-06-09 NOTE — Telephone Encounter (Signed)
Pt called 4/19 @ 1:22pm. Stated she needs a 90 day supply of trulicity for her insurance to cover it. Pt also stated she needs a PA for dexcom for her insurance as well.     Pt# [phone]

## 2020-06-09 NOTE — Telephone Encounter (Signed)
I returned this call and went over the way to do the Trulicity in steps. I told her to do the second dose of .75 mg and then do the 2 weeks of the 1.5 mg and to let me know if she is good with that dose and then I can do a script for 90 days of the 3 mg. She seemed to understand this information. She then said that she spoke with her insurance company and they said they don't cover the [employer] but will cover Dexcom. I asked her to call the local pharmacy she uses to see if they cary the Dexcom before I write the script there.  Kennon Rounds

## 2020-06-18 ENCOUNTER — Encounter

## 2020-06-18 ENCOUNTER — Ambulatory Visit: Payer: PRIVATE HEALTH INSURANCE | Attending: Registered" | Primary: Family Medicine

## 2020-06-18 MED ORDER — DEXCOM G6 RECEIVER
0 refills | Status: AC
Start: 2020-06-18 — End: ?

## 2020-06-18 MED ORDER — DEXCOM G6 TRANSMITTER DEVICE
3 refills | Status: AC
Start: 2020-06-18 — End: ?

## 2020-06-18 MED ORDER — DEXCOM G6 SENSOR DEVICE
3 refills | Status: AC
Start: 2020-06-18 — End: ?

## 2020-06-18 NOTE — Telephone Encounter (Signed)
06/18/2020  9:40 AM    Patient called and stated that her insurance wont cover the libre 2 but will cover the dexcom instead- please call her back at [phone]- thanks

## 2020-06-18 NOTE — Telephone Encounter (Signed)
Rx for dexcom sent to local pharmacy

## 2020-06-18 NOTE — Telephone Encounter (Signed)
I attempted to call Ms. Boxer and reached a Recruitment consultant. I left a message letting her know that the Dexcom had been ordered  Kennon Rounds

## 2020-06-23 NOTE — Telephone Encounter (Signed)
06/23/2020  10:58 am      Pt stated she was calling in reference to the dexcom prescription.Pt stated she had called last week to speak with a nurse and no one ever called her back.TX#[phone]      Thanks,  Janifer Adie

## 2020-06-24 NOTE — Telephone Encounter (Signed)
I returned this call and let her know that scripts for the dexcom had been sent to the pharmacy on 06/18/2020 and told her to contact the pharmacy to see if they were ready or not.  Kennon Rounds

## 2020-06-26 ENCOUNTER — Encounter: Payer: PRIVATE HEALTH INSURANCE | Attending: Registered" | Primary: Family Medicine

## 2020-07-01 ENCOUNTER — Encounter

## 2020-07-01 ENCOUNTER — Inpatient Hospital Stay: Admit: 2020-07-01 | Payer: PRIVATE HEALTH INSURANCE | Primary: Family Medicine

## 2020-07-01 DIAGNOSIS — M1711 Unilateral primary osteoarthritis, right knee: Secondary | ICD-10-CM

## 2020-07-02 NOTE — Telephone Encounter (Signed)
07/02/2020  11:18 AM    Patient called and is upset due to her dexcom prior auth being rejected due to not enough evidence that she needs it- she stated that they sent over a new prior auth yesterday and to please fill it out and send back. She would also like a phone call from nurse to [phone] thanks

## 2020-07-03 ENCOUNTER — Institutional Professional Consult (permissible substitution)
Admit: 2020-07-03 | Discharge: 2020-07-03 | Payer: PRIVATE HEALTH INSURANCE | Attending: Registered" | Primary: Family Medicine

## 2020-07-03 DIAGNOSIS — E1165 Type 2 diabetes mellitus with hyperglycemia: Secondary | ICD-10-CM

## 2020-07-03 NOTE — Progress Notes (Signed)
[facility]  Diabetes Self-Management Education & Support Program    Reason for Referral: T2DM  Referral Source: Domingo SepWagner, Christina, MD  Services requested: DSMES, MNT       ASSESSMENT    From my perspective, the participant [facility] to reducing risks, healthy eating, monitoring, taking medications, physical activity, healthy coping and problem solving. Will adapt DSMES program to build on participant's skills score and confidence score as noted in the Diabetes Skills, Confidence, and Preparedness Index.    During the program, we will focus on providing DSMES that specifically addresses participant's interest in healthy eating, as shown by their reported readiness to change.    The participant would be best served by attending weekly group class series.     Participant shared she has numbness in her hands and plans to follow up with her provider.    Diabetes Self-Management Education Follow-up Visit: 07/22/20       Clinical Presentation  Amber Valdez is a 50 y.o. African American female referred for diabetes self-management education. Participant has Type 2 DM not on insulin for 1-10 years. Family history positive for diabetes. Patient reports receiving DSMES services in the past. 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    glipiZIDE SR (GLUCOTROL XL) 10 mg CR tablet 2 tablets once a day    dulaglutide (Trulicity) 3 mg/0.5 mL pnij 3 mg by SubCUTAneous route every seven (7) days.    empagliflozin (Jardiance) 25 mg tablet One tablet once a day     metFORMIN (GLUCOPHAGE) 500 mg tablet 1 tablet twice a day          Blood Pressure Management  Key ACE/ARB Medications             lisinopriL (PRINIVIL, ZESTRIL) 10 mg tablet Take 1 Tablet by mouth daily.          Lipid Management  Key Antihyperlipidemia Meds     The patient is on no antihyperlipidemia meds.          Clot Prevention  Key Anti-Platelet Anticoagulant Meds     The patient is on no antiplatelet meds or anticoagulants.          Learning Assessment  Learning objectives Educator assessment (07/03/2020)   Diabetes Disease Process  The participant can   A) describe diabetes in basic terms;   B) state the type of diabetes they have; &   C) state accepted blood glucose targets.     Healthy Eating  The participant can   A) identify carbohydrate foods; &   B) accurately read food labels.     Being Active  The participant can  A) state the benefits of physical activity;  B) report their current PA practices;  C) identify PA they would consider incorporating in their lives; &  D) develop an implementation plan.     Monitoring  The participant can  A) operate their blood glucose meter; &  B) describe how they log their  blood glucoses to share with their provider.     Taking Medications  The participant can  A) name their diabetes medications;  B) state the purpose and dose;  C) note side effects; &  D) describe proper storage, disposal & transport (if appropriate).     Healthy Coping  The participant can    A) describe their response to diabetes diagnosis;  B) describe their specific coping mechanisms;  C) identify supportive people and/or other resources that positively support their diabetes self-care and health.    Reducing Risks  The participant can describe the preventive measures used by providers to promote health and prevent diabetes complications.     Problem Solving  The participant can   A) identify signs, symptoms & treatment of hypoglycemia;    B) identify signs, symptoms & treatment of  hyperglycemia;  C) describe their sick day plan; &  D) identify BG patterns to discuss with their provider.       No  No  No        Yes  No        Yes  Yes   No  No        Yes  No        Yes  No  No  Yes        Yes  Yes- listens to music, meditates  Yes        Yes          No  Yes  No  Yes     Characteristics to Learning   Barriers to Learning      Pain and Transportation, Work schedule      Favorite Ways to Learn   []  Lecture  []  Slides  []  Reading []  Video-Internet  []  Cassettes/CDs/MP3's  []  Interactive Small Groups [x]  Other: Hands-on learning       Behavioral Assessment  Current self-care practices  Educator assessment (07/03/2020)   Healthy Eating   Current practices    24-hour Dietary Recall:  Breakfast: None  Lunch: None  Dinner: Quarter-pound cheeseburger  Snacks: Lemon italian ice  Beverages: Cranberry grape juice (12 ounces, with dinner), water (x4, 20 ounce bottles)  Alcohol: None    *Participant shared she recently made changes by choosing chocolate cake less often and choosing smaller portions.     Would benefit from [facility] related to Healthy Eating: Yes    Eats a carbohydrate controlled diet: No    Stage of change: Action      Being Active  Current practices  How many days during the past week have you performed physical activity where your heart beats faster and your breathing is harder than normal for 30 minutes or more?  0 day(s)    How many days in a typical week do you perform activity such as this?  0 day(s)    *Participant shared she walks as part of her daily routine at work and does sit-ups daily.     Would benefit from [facility] related to Being Active: Yes      Exercises 150 minutes/week: No      Stage of change: Action     Monitoring  Current practices  Do you monitor your blood sugar? Yes     How often do you monitor? 4x/day    What are the range of readings? 105-352 mg/dL  Breakfast: mg/dL (preprandial)   Afternoon: 250-280 mg/dL (preprandial)   Dinner: 180 mg/dL (2-3 hours  postprandial)  Bedtime: 125-130  mg/dL    Do you know your last A1c measurement? No    Do you know the meaning of the A1c? No     Would benefit from [facility] related to Monitoring: Yes      Uses BG readings to establish trends and understand BG patterns: Yes      Stage of change: Action       Taking Medication  Current practices  Do you understand what your diabetes medications do? No    How often do you miss doses of your diabetes medications? never    Can you afford your diabetes medications? Yes   Would benefit from [facility] related to Taking Medication: Yes      Takes medications consistently to receive full benefit: Yes      Stage of change: Action       Healthy Coping   Current state  Diabetes Skills, Confidence and Preparedness Index:  Total score: 5.2  Skills: 4.8  Confidence: 4.1  Preparedness: 7.0       Would benefit from DSMES related to Healthy Coping: Yes      Identifies specific people, organizations,etc, that actively support their diabetes self-care efforts: Yes- participant identified her family and healthcare team.      Stage of change: Action     Reducing Risks  Current state  Vaccines:  Influenza: Participant shared she has received this vaccine within the past one year.    Pneumococcal: Participant shared she has received this vaccine.    Hepatitis: Participant shared she has received this vaccine.    Examinations:  Dilated eye exam: Last appointment was: 11/2019 per participant report    Dental exam: last appointment was: 2019 per participant report, shared she plans to schedule an appointment    Foot exam: Last appointment was: 5 years ago per participant report, shared she plans to schedule an appointment    Heart Protection:  BP Readings from Last 2 Encounters:   02/25/20 120/79        Lab Results   Component Value Date/Time    LDL, calculated 79.6 02/25/2020 05:14 PM        Kidney Protection:  Lab Results   Component Value Date/Time    Microalbumin/Creat ratio (mg/g creat) 30 02/25/2020 05:14 PM     Microalbumin,urine random 2.33 02/25/2020 05:14 PM        Would benefit from [facility] related to Reducing Risks: Yes      Actively participates in decision-making with provider regarding secondary prevention:  Yes      Stage of change: Action   Problem Solving  Current state  Hypoglycemia Management:  What are signs and symptoms of hypoglycemia that you experience: Shaking/trembling, Dizziness/light-headedness, Blurred vision    How do you prevent hypoglycemia: take medications as instructed and monitor BG regularly, balanced meals    How do you treat hypoglycemia: drink 12 ounces orange juice and a have 2 peppermints    Hyperglycemia Management:  What are signs and symptoms of hyperglycemia that you experience: Extreme thirst, Frequent urination     How can you prevent hyperglycemia: engage in regular physical activity, monitor blood sugar and focus on meal planning    Sick Day Management:  What do you do differently on sick days:  Pt reported being unaware of self-management on sick days    Pattern Management:  Do you notice blood glucose patterns when you look at the readings in your meter or logbook? Yes    How do you use the blood  glucose readings from your meter or logbook? understand how body responds to medications and/or insulin     Would benefit from [facility] related to Problem Solving: Yes      Articulates appropriate strategies to address hypoglycemia, hyperglycemia, sick day care and BG pattern: No, participant did not identify strategies for sick day management.      Stage of change: Action       Note: Content derived from the American Association of Diabetes Educators' Diabetes Education Curriculum: A Guide to Successful Self-Management (3rd edition)      Estanislado Emms, MS, RDN on 07/03/2020 at 2:33 PM    I have personally reviewed the health record, including provider notes, laboratory data and current medications before making these care and education recommendations.  Total minutes: 45  minutes    Additional Data for SCPI:  Diabetes Skills, Confidence & Preparedness Index (SCPI) ??  All scales and questions are out of 7.    Overall SCPI score: 5.2 Skills Score: 4.8  Low: Healthy Eating(Q1),Problem Solving(Q6),Reducing Risks(Q9) Confidence Score: 4.1  Low: Healthy Eating(Q1),Healthy Eating(Q4),Problem [id number]) Preparedness Score: 7.0  Low: Healthy Eating(Q1),Being Active(Q2),Healthy Coping(Q3),Reducing Risks(Q4),Blood Sugar Monitoring(Q6),Problem Solving(Q7)   Healthy Eating Score: 2.5  Low: Skills(Q1),Confidence(Q1),Confidence(Q4) Taking Medication Score: 7.0  Low: Skills(Q2) Blood Sugar Monitoring Score: 7.0  Low: Skills(Q3),Skills(Q4),Skills(Q8),Confidence(Q6),Preparedness(Q6) Reducing Risks Score: 4.5  Low: TWSFKC(L2)   Problem Solving Score: 3.0  Low: Skills(Q6),Confidence(Q7) Healthy Coping Score: 7.0  Low: Skills(Q7),Confidence(Q2),Preparedness(Q3) Being Active Score: 7.0  Low: Confidence(Q5),Preparedness(Q2)     Skills/Knowledge Questions   1. I know how to plan meals that have the best balance between carbohydrates, proteins and vegetables. 1   2. I know how my diabetes medications (pills, injectables and/or insulin) work in my body. 7   3. I know when to check my blood sugar if I want to see how my body responded to a meal. 7   4. I know when to check my blood sugars to determine if my medication or insulin doses are correct. 7   5. I know what to do to prevent a low blood sugar when I exercise (either before, during, or after). 5   6. When I am sick, I know what to do differently with my diabetes management. 1   7. I know how stress can affect my diabetes management. 7   8. When I look at my blood sugars over a given week, I can explain what my blood sugar pattern is. 7   9. I know what my target levels are for A1c, blood pressure and cholesterol. 1   Confidence Questions   1. I am confident that I can plan balanced meals and snacks. 1   2. I am confident that I can manage my stress. 7    3. I am confident that I can prevent a low blood sugar during or after exercise. 5   4. I am confident that the next time I eat out, I will be able to choose foods that best keep my blood sugars in target. 1   5. I am confident I can include exercise into my schedule. 7   6. I am confident that I can use my daily blood sugars to adjust my diet, my activity, and/or my insulin. 7   7. When something out of my normal routine happens, I am confident that I can problem-solve and keep my diabetes on track. 1   Preparedness Questions   1. Within the next month, I will begin to eat more balanced  meals and snacks. 8   2. Within the next month, I will choose an exercise activity and I will start fitting it into my schedule. 7   3. Within the next month, I will make a list of stress management options that work for me. 7   4. Within the next month, I will consistently plan ahead to prevent low blood sugars. 7   5. Within the next month, I will start adjusting my insulin doses on my own. 0   6. Within the next month, I will begin making changes to my diabetes management based on my daily blood sugars (eg - eating, activity and/or insulin). 7   7. Within the next month, I will begin making changes to my diabetes management to meet my overall goals (eg - eating, activity and/or insulin). 7

## 2020-07-03 NOTE — Telephone Encounter (Signed)
All forms faxed back  Kennon Rounds

## 2020-07-08 NOTE — Telephone Encounter (Signed)
07/08/2020  4:41 PM        Pt called and stated she need prior authorization from Dr.Wagner for her dexcom.Pt stated her insurance company is rejecting the dexcom because they don't have enough reasons to approve it.Pt stated she needs Dr.Wagner to explain why she needs the dexcom.[phone]      Thanks,  Janifer Adie

## 2020-07-09 NOTE — Telephone Encounter (Signed)
Spoke with pt and she stated that the Josephine Igo is not covered by her insurance but the correct documentation is given for the PA, they will cover the dexcom. Pt was made then aware that we will need her to bring in her bs readings. Pt stated that she will stop by the office on Monday with her readings.

## 2020-07-13 ENCOUNTER — Ambulatory Visit: Attending: "Endocrinology | Primary: Family Medicine

## 2020-07-22 ENCOUNTER — Ambulatory Visit: Primary: Family Medicine

## 2020-07-29 ENCOUNTER — Ambulatory Visit: Payer: PRIVATE HEALTH INSURANCE | Primary: Family Medicine

## 2020-08-05 ENCOUNTER — Ambulatory Visit: Payer: PRIVATE HEALTH INSURANCE | Primary: Family Medicine

## 2020-08-12 ENCOUNTER — Ambulatory Visit: Payer: PRIVATE HEALTH INSURANCE | Primary: Family Medicine

## 2020-10-26 ENCOUNTER — Inpatient Hospital Stay
Admit: 2020-10-26 | Discharge: 2020-10-26 | Disposition: A | Discharge status: Home / Self Care | Payer: MEDICAID | Attending: Emergency Medicine

## 2020-10-26 ENCOUNTER — Emergency Department: Admit: 2020-10-26 | Payer: MEDICAID | Primary: Family Medicine

## 2020-10-26 ENCOUNTER — Emergency Department

## 2020-10-26 DIAGNOSIS — J209 Acute bronchitis, unspecified: Secondary | ICD-10-CM

## 2020-10-26 LAB — CBC WITH AUTO DIFFERENTIAL
Basophils %: 1 % (ref 0–1)
Basophils Absolute: 0 10*3/uL (ref 0.0–0.1)
Eosinophils %: 3 % (ref 0–7)
Eosinophils Absolute: 0.2 10*3/uL (ref 0.0–0.4)
Granulocyte Absolute Count: 0 10*3/uL (ref 0.00–0.04)
Hematocrit: 38 % (ref 35.0–47.0)
Hemoglobin: 12.7 g/dL (ref 11.5–16.0)
Immature Granulocytes: 0 % (ref 0.0–0.5)
Lymphocytes %: 28 % (ref 12–49)
Lymphocytes Absolute: 2.1 10*3/uL (ref 0.8–3.5)
MCH: 31.3 PG (ref 26.0–34.0)
MCHC: 33.4 g/dL (ref 30.0–36.5)
MCV: 93.6 FL (ref 80.0–99.0)
MPV: 10.6 FL (ref 8.9–12.9)
Monocytes %: 8 % (ref 5–13)
Monocytes Absolute: 0.6 10*3/uL (ref 0.0–1.0)
NRBC Absolute: 0 10*3/uL (ref 0.00–0.01)
Neutrophils %: 60 % (ref 32–75)
Neutrophils Absolute: 4.5 10*3/uL (ref 1.8–8.0)
Nucleated RBCs: 0 PER 100 WBC
Platelets: 351 10*3/uL (ref 150–400)
RBC: 4.06 M/uL (ref 3.80–5.20)
RDW: 12.1 % (ref 11.5–14.5)
WBC: 7.5 10*3/uL (ref 3.6–11.0)

## 2020-10-26 LAB — INFLUENZA A+B VIRAL AGS
Flu A Antigen: NEGATIVE
Influenza A Antigen: NEGATIVE
Influenza B Antigen: NEGATIVE
Influenza B Antigen: NEGATIVE

## 2020-10-26 LAB — COMPREHENSIVE METABOLIC PANEL
ALT: 28 U/L (ref 12–78)
AST: 22 U/L (ref 15–37)
Albumin/Globulin Ratio: 0.7 — ABNORMAL LOW (ref 1.1–2.2)
Albumin: 3.2 g/dL — ABNORMAL LOW (ref 3.5–5.0)
Alkaline Phosphatase: 87 U/L (ref 45–117)
Anion Gap: 3 mmol/L — ABNORMAL LOW (ref 5–15)
BUN: 18 MG/DL (ref 6–20)
Bun/Cre Ratio: 13 (ref 12–20)
CO2: 31 mmol/L (ref 21–32)
Calcium: 8.7 MG/DL (ref 8.5–10.1)
Chloride: 101 mmol/L (ref 97–108)
Creatinine: 1.41 MG/DL — ABNORMAL HIGH (ref 0.55–1.02)
EGFR IF NonAfrican American: 40 mL/min/{1.73_m2} — ABNORMAL LOW (ref >60)
GFR African American: 48 mL/min/{1.73_m2} — ABNORMAL LOW (ref >60)
Globulin: 4.6 g/dL — ABNORMAL HIGH (ref 2.0–4.0)
Glucose: 306 mg/dL — ABNORMAL HIGH (ref 65–100)
Potassium: 4.8 mmol/L (ref 3.5–5.1)
Sodium: 135 mmol/L — ABNORMAL LOW (ref 136–145)
Total Bilirubin: 0.3 MG/DL (ref 0.2–1.0)
Total Protein: 7.8 g/dL (ref 6.4–8.2)

## 2020-10-26 LAB — URINALYSIS W/ REFLEX CULTURE
BACTERIA, URINE: NEGATIVE /hpf
Bacteria: NEGATIVE /hpf
Bilirubin, Urine: NEGATIVE
Bilirubin: NEGATIVE
Blood, Urine: NEGATIVE
Blood: NEGATIVE
Glucose, Ur: >1000 mg/dL — AB
Glucose: >1000 mg/dL — AB
Ketone: NEGATIVE mg/dL
Ketones, Urine: NEGATIVE mg/dL
Leukocyte Esterase, Urine: NEGATIVE
Leukocyte Esterase: NEGATIVE
Nitrite, Urine: NEGATIVE
Nitrites: NEGATIVE
Protein, UA: NEGATIVE mg/dL
Protein: NEGATIVE mg/dL
Specific Gravity, UA: <1.005 NA
Specific gravity: <1.005
Urobilinogen, UA, POCT: 0.2 EU/dL (ref 0.2–1.0)
Urobilinogen: 0.2 EU/dL (ref 0.2–1.0)
pH (UA): 6.5 (ref 5.0–8.0)
pH, UA: 6.5 (ref 5.0–8.0)

## 2020-10-26 LAB — LIPASE
Lipase: 230 U/L (ref 73–393)
Lipase: 230 U/L (ref 73–393)

## 2020-10-26 LAB — COVID-19, RAPID: SARS-CoV-2, Rapid: NOT DETECTED

## 2020-10-26 LAB — TROPONIN, HIGH SENSITIVITY: Troponin, High Sensitivity: 4 ng/L (ref 0–51)

## 2020-10-26 LAB — CBC WITH AUTOMATED DIFF
ABS. BASOPHILS: 0 10*3/uL (ref 0.0–0.1)
ABS. EOSINOPHILS: 0.2 10*3/uL (ref 0.0–0.4)
ABS. IMM. GRANS.: 0 10*3/uL (ref 0.00–0.04)
ABS. LYMPHOCYTES: 2.1 10*3/uL (ref 0.8–3.5)
ABS. MONOCYTES: 0.6 10*3/uL (ref 0.0–1.0)
ABS. NEUTROPHILS: 4.5 10*3/uL (ref 1.8–8.0)
ABSOLUTE NRBC: 0 10*3/uL (ref 0.00–0.01)
BASOPHILS: 1 % (ref 0–1)
EOSINOPHILS: 3 % (ref 0–7)
HCT: 38 % (ref 35.0–47.0)
HGB: 12.7 g/dL (ref 11.5–16.0)
IMMATURE GRANULOCYTES: 0 % (ref 0.0–0.5)
LYMPHOCYTES: 28 % (ref 12–49)
MCH: 31.3 PG (ref 26.0–34.0)
MCHC: 33.4 g/dL (ref 30.0–36.5)
MCV: 93.6 FL (ref 80.0–99.0)
MONOCYTES: 8 % (ref 5–13)
MPV: 10.6 FL (ref 8.9–12.9)
NEUTROPHILS: 60 % (ref 32–75)
NRBC: 0 PER 100 WBC
PLATELET: 351 10*3/uL (ref 150–400)
RBC: 4.06 M/uL (ref 3.80–5.20)
RDW: 12.1 % (ref 11.5–14.5)
WBC: 7.5 10*3/uL (ref 3.6–11.0)

## 2020-10-26 LAB — METABOLIC PANEL, COMPREHENSIVE
A-G Ratio: 0.7 — ABNORMAL LOW (ref 1.1–2.2)
ALT (SGPT): 28 U/L (ref 12–78)
AST (SGOT): 22 U/L (ref 15–37)
Albumin: 3.2 g/dL — ABNORMAL LOW (ref 3.5–5.0)
Alk. phosphatase: 87 U/L (ref 45–117)
Anion gap: 3 mmol/L — ABNORMAL LOW (ref 5–15)
BUN/Creatinine ratio: 13 (ref 12–20)
BUN: 18 MG/DL (ref 6–20)
Bilirubin, total: 0.3 MG/DL (ref 0.2–1.0)
CO2: 31 mmol/L (ref 21–32)
Calcium: 8.7 MG/DL (ref 8.5–10.1)
Chloride: 101 mmol/L (ref 97–108)
Creatinine: 1.41 MG/DL — ABNORMAL HIGH (ref 0.55–1.02)
GFR est AA: 48 mL/min/{1.73_m2} — ABNORMAL LOW (ref >60)
GFR est non-AA: 40 mL/min/{1.73_m2} — ABNORMAL LOW (ref >60)
Globulin: 4.6 g/dL — ABNORMAL HIGH (ref 2.0–4.0)
Glucose: 306 mg/dL — ABNORMAL HIGH (ref 65–100)
Potassium: 4.8 mmol/L (ref 3.5–5.1)
Protein, total: 7.8 g/dL (ref 6.4–8.2)
Sodium: 135 mmol/L — ABNORMAL LOW (ref 136–145)

## 2020-10-26 LAB — TROPONIN-HIGH SENSITIVITY: Troponin-High Sensitivity: 4 ng/L (ref 0–51)

## 2020-10-26 LAB — COVID-19 RAPID TEST: COVID-19 rapid test: NOT DETECTED

## 2020-10-26 MED ORDER — AZELASTINE 0.15 % (205.5 MCG) NASAL SPRAY
205.5 mcg (0.15 %) | Freq: Two times a day (BID) | NASAL | 0 refills | Status: AC
Start: 2020-10-26 — End: 2020-11-09

## 2020-10-26 MED ORDER — CETIRIZINE 10 MG TAB
10 mg | ORAL | Status: AC
Start: 2020-10-26 — End: 2020-10-26
  Administered 2020-10-26: 17:00:00 via ORAL

## 2020-10-26 MED ORDER — ACETAMINOPHEN 500 MG TAB
500 mg | ORAL | Status: AC
Start: 2020-10-26 — End: 2020-10-26
  Administered 2020-10-26: 17:00:00 via ORAL

## 2020-10-26 MED ORDER — CETIRIZINE 10 MG TAB
10 mg | ORAL_TABLET | Freq: Every day | ORAL | 0 refills | Status: AC
Start: 2020-10-26 — End: 2020-11-09

## 2020-10-26 MED ORDER — BENZONATATE 100 MG CAP
100 mg | ORAL | Status: AC
Start: 2020-10-26 — End: 2020-10-26
  Administered 2020-10-26: 17:00:00 via ORAL

## 2020-10-26 MED ORDER — BENZONATATE 100 MG CAP
100 mg | ORAL_CAPSULE | Freq: Three times a day (TID) | ORAL | 0 refills | Status: AC | PRN
Start: 2020-10-26 — End: 2020-11-02

## 2020-10-26 MED FILL — ACETAMINOPHEN 500 MG TAB: 500 mg | ORAL | Qty: 2

## 2020-10-26 MED FILL — CETIRIZINE 10 MG TAB: 10 mg | ORAL | Qty: 1

## 2020-10-26 MED FILL — BENZONATATE 100 MG CAP: 100 mg | ORAL | Qty: 1

## 2020-10-26 NOTE — ED Notes (Signed)
Pt c/o coughing, nausea, vomiting, fatigue, sore throat and SOB x 1 week. Taking mucinex and benadryl with minimal relief.

## 2020-10-26 NOTE — ED Provider Notes (Signed)
ED Provider Notes by Ned Grace, [employer] [employer]shore Jerilynn Mages, NP at 10/26/20 1151                Author: Ned Grace, Philis Pique, NP  Service: Emergency Medicine  Author Type: Nurse Practitioner       Filed: 10/26/20 1525  Date of Service: 10/26/20 1151  Status: Attested           Editor: Ned Grace, Jahanna Raether Jerilynn Mages, NP (Nurse Practitioner)  Cosigner: Sharlyne Cai, MD at 10/26/20 2212          Attestation signed by Sharlyne Cai, MD at 10/26/20 2212          I was personally available for consultation in the emergency department.  I have reviewed the chart and agree with the documentation recorded by the [facility], including  the assessment, treatment plan, and disposition.   Sharlyne Cai, MD                               50 year old female with past medical history of diabetes, neuropathy, hypertension, depression presents ambulatory  with complaints of cough, nausea, vomiting, fatigue, sore throat and shortness of breath from coughing so much x1 week.  She also states that she has been urinating frequently.  Patient states that she has been taking Benadryl and Mucinex with minimal  relief.   She denies chest pain, ear pain, sinus pain, difficulty swallowing, diarrhea, dysuria or difficulty urinating.             Past Medical History:        Diagnosis  Date         ?  Diabetes ([place])       ?  Neuropathy           ?  Secondary hypertension               Past Surgical History:         Procedure  Laterality  Date          ?  HX ORTHOPAEDIC                 No family history on file.        Social History          Socioeconomic History         ?  Marital status:  SINGLE              Spouse name:  Not on file         ?  Number of children:  Not on file     ?  Years of education:  Not on file     ?  Highest education level:  Not on file       Occupational History        ?  Not on file       Tobacco Use         ?  Smoking status:  Some Days     ?  Smokeless tobacco:  Never       Substance and Sexual Activity         ?  Alcohol use:  Not  Currently     ?  Drug use:  Not Currently     ?  Sexual activity:  Not Currently        Other Topics  Concern        ?  Not on file       Social History Narrative        ?  Not on file          Social Determinants of Health          Financial Resource Strain: Not on file     Food Insecurity: Not on file     Transportation Needs: Not on file     Physical Activity: Not on file     Stress: Not on file     Social Connections: Not on file     Intimate Partner Violence: Not on file       Housing Stability: Not on file              ALLERGIES: Invega [paliperidone] and Lamictal [lamotrigine]      Review of Systems    Constitutional:  Positive for fatigue. Negative for chills and fever.    HENT:  Positive for sore throat. Negative for ear pain, sinus pain, trouble swallowing and voice change.     Respiratory:  Positive for cough and shortness of breath .          Shortness of breath when coughing    Cardiovascular:  Negative for chest pain.    Gastrointestinal:  Positive for nausea and vomiting . Negative for abdominal pain, constipation and diarrhea.    Genitourinary:  Positive for frequency. Negative for difficulty urinating, vaginal bleeding and vaginal discharge.    Skin:  Negative for rash.    Neurological:  Negative for dizziness, light-headedness and headaches.         Vitals:          10/26/20 1107        BP:  137/86     Pulse:  74     Resp:  20     Temp:  97.5 ??F (36.4 ??C)        SpO2:  99%                Physical Exam   Vitals and nursing note reviewed.    Constitutional:        General: She is not in acute distress.      Appearance: Normal appearance. She is well-developed. She is obese. She is not ill-appearing.    HENT:       Head: Normocephalic.       Right Ear: Hearing and tympanic membrane normal.       Left Ear: Hearing and tympanic membrane normal.       Nose: Nose normal. No congestion or rhinorrhea.       Right Turbinates: Not enlarged.       Left Turbinates: Not enlarged.       Right Sinus: No maxillary  sinus tenderness or frontal sinus tenderness.       Left Sinus: No maxillary sinus tenderness or frontal sinus tenderness.       Mouth/Throat:       Lips: Pink.       Mouth: Mucous membranes are moist.       Pharynx: Oropharynx is clear.       Tonsils: No tonsillar exudate or tonsillar abscesses.    Cardiovascular:       Rate and Rhythm: Normal rate.    Pulmonary:       Effort: Pulmonary effort is normal. No respiratory distress.       Breath sounds: Normal breath sounds. No wheezing, rhonchi or rales.    Chest:  Chest wall: No tenderness or crepitus.    Abdominal:       General: Bowel sounds are normal. There is no distension.       Palpations: Abdomen is soft.       Tenderness: There is no abdominal tenderness. There is no rebound.     Musculoskeletal:          General: Normal range of motion.       Cervical back: Normal range of motion.    Skin:      General: Skin is warm and dry.       Capillary Refill: Capillary refill takes less than 2 seconds.    Neurological:       Mental Status: She is alert and oriented to person, place, and time.    Psychiatric:          Mood and Affect: Mood normal.           MDM   Number of Diagnoses or Management Options   Acute bronchitis, unspecified organism: minor   Hyperglycemia: established and improving   Diagnosis management comments: Differential diagnoses: UTI versus acute bronchitis versus strep throat versus dehydration versus hyperglycemia versus pleural effusion versus URI versus COVID/flu          Amount and/or Complexity of Data Reviewed   Clinical lab tests: ordered and reviewed   Tests in the radiology section of CPT?? : ordered and reviewed      Risk of Complications, Morbidity, and/or Mortality   Presenting problems: moderate   Diagnostic procedures: moderate   Management options: low      Patient Progress   Patient progress: improved             Procedures      VITAL SIGNS:   No data found.        LABS:     Recent Results (from the past 6 hour(s))     EKG, 12  LEAD, INITIAL          Collection Time: 10/26/20 11:12 AM         Result  Value  Ref Range            Ventricular Rate  85  BPM       Atrial Rate  85  BPM       P-R Interval  140  ms       QRS Duration  72  ms       Q-T Interval  374  ms       QTC Calculation (Bezet)  445  ms       Calculated P Axis  61  degrees       Calculated R Axis  73  degrees       Calculated T Axis  54  degrees       Diagnosis                 Normal sinus rhythm   Low voltage QRS   Borderline ECG   No previous ECGs available          CBC WITH AUTOMATED DIFF          Collection Time: 10/26/20 12:28 PM         Result  Value  Ref Range            WBC  7.5  3.6 - 11.0 K/uL       RBC  4.06  3.80 - 5.20 M/uL  HGB  12.7  11.5 - 16.0 g/dL       HCT  38.0  35.0 - 47.0 %       MCV  93.6  80.0 - 99.0 FL       MCH  31.3  26.0 - 34.0 PG       MCHC  33.4  30.0 - 36.5 g/dL       RDW  12.1  11.5 - 14.5 %       PLATELET  351  150 - 400 K/uL       MPV  10.6  8.9 - 12.9 FL       NRBC  0.0  0 PER 100 WBC       ABSOLUTE NRBC  0.00  0.00 - 0.01 K/uL       NEUTROPHILS  60  32 - 75 %       LYMPHOCYTES  28  12 - 49 %       MONOCYTES  8  5 - 13 %       EOSINOPHILS  3  0 - 7 %       BASOPHILS  1  0 - 1 %       IMMATURE GRANULOCYTES  0  0.0 - 0.5 %       ABS. NEUTROPHILS  4.5  1.8 - 8.0 K/UL       ABS. LYMPHOCYTES  2.1  0.8 - 3.5 K/UL       ABS. MONOCYTES  0.6  0.0 - 1.0 K/UL       ABS. EOSINOPHILS  0.2  0.0 - 0.4 K/UL       ABS. BASOPHILS  0.0  0.0 - 0.1 K/UL       ABS. IMM. GRANS.  0.0  0.00 - 0.04 K/UL       DF  AUTOMATED          METABOLIC PANEL, COMPREHENSIVE          Collection Time: 10/26/20 12:28 PM         Result  Value  Ref Range            Sodium  135 (L)  136 - 145 mmol/L       Potassium  4.8  3.5 - 5.1 mmol/L       Chloride  101  97 - 108 mmol/L       CO2  31  21 - 32 mmol/L       Anion gap  3 (L)  5 - 15 mmol/L       Glucose  306 (H)  65 - 100 mg/dL       BUN  18  6 - 20 MG/DL            Creatinine  1.41 (H)  0.55 - 1.02 MG/DL             BUN/Creatinine ratio  13  12 - 20         GFR est AA  48 (L)  >60 ml/min/1.39m       GFR est non-AA  40 (L)  >60 ml/min/1.7105m      Calcium  8.7  8.5 - 10.1 MG/DL       Bilirubin, total  0.3  0.2 - 1.0 MG/DL       ALT (SGPT)  28  12 - 78 U/L       AST (SGOT)  22  15 - 37 U/L  Alk. phosphatase  87  45 - 117 U/L       Protein, total  7.8  6.4 - 8.2 g/dL       Albumin  3.2 (L)  3.5 - 5.0 g/dL       Globulin  4.6 (H)  2.0 - 4.0 g/dL       A-G Ratio  0.7 (L)  1.1 - 2.2         TROPONIN-HIGH SENSITIVITY          Collection Time: 10/26/20 12:28 PM         Result  Value  Ref Range            Troponin-High Sensitivity  4  0 - 51 ng/L       LIPASE          Collection Time: 10/26/20 12:28 PM         Result  Value  Ref Range            Lipase  230  73 - 393 U/L       INFLUENZA A+B VIRAL AGS          Collection Time: 10/26/20 12:46 PM         Result  Value  Ref Range            Influenza A Antigen  Negative  NEG         Influenza B Antigen  Negative  NEG         COVID-19 RAPID TEST          Collection Time: 10/26/20 12:47 PM         Result  Value  Ref Range            Specimen source  Nasopharyngeal          COVID-19 rapid test  Not detected  NOTD         URINALYSIS W/ REFLEX CULTURE          Collection Time: 10/26/20 12:48 PM       Specimen: Urine         Result  Value  Ref Range            Color  YELLOW/STRAW          Appearance  CLEAR  CLEAR         Specific gravity  <1.005         pH (UA)  6.5  5.0 - 8.0         Protein  Negative  NEG mg/dL       Glucose  >1,000 (A)  NEG mg/dL       Ketone  Negative  NEG mg/dL       Bilirubin  Negative  NEG         Blood  Negative  NEG         Urobilinogen  0.2  0.2 - 1.0 EU/dL       Nitrites  Negative  NEG         Leukocyte Esterase  Negative  NEG         UA:UC IF INDICATED  CULTURE NOT INDICATED BY UA RESULT          WBC  0-4  0 - 4 /hpf       RBC  0-5  0 - 5 /hpf       Epithelial cells  FEW  FEW /lpf       Bacteria  Negative  NEG /hpf            Hyaline cast  0-2  0 - 5 /lpf             IMAGING:     XR CHEST PA LAT       Final Result     1. No acute cardiopulmonary disease                          Medications During Visit:     Medications       cetirizine (ZYRTEC) tablet 10 mg (10 mg Oral Given 10/26/20 1244)     acetaminophen (TYLENOL) tablet 1,000 mg (1,000 mg Oral Given 10/26/20 1244)       benzonatate (TESSALON) capsule 100 mg (100 mg Oral Given 10/26/20 1244)              DECISION MAKING:   Amber Valdez is a 50 y.o. female who comes in as above.    50 year old female with past medical history of diabetes, neuropathy, hypertension, depression presents ambulatory with complaints of cough, nausea, vomiting, fatigue, sore throat and shortness of breath from coughing so much x1 week.  She also states  that she has been urinating frequently.  Patient states that she has been taking Benadryl and Mucinex with minimal relief.   She denies chest pain, ear pain, sinus pain, difficulty swallowing, diarrhea, dysuria or difficulty urinating.    Vital signs are stable, on exam patient with strong cough, no rhonchi rales or wheezing appreciated.  Patient states that she is still smoking, eating and drinking without difficulty.  Discussed plan with patient to obtain chest x-ray, basic labs, urine  and EKG.   1400-patient reevaluated, improvement of symptoms.  Discussed all results with patient.  Glucose and large amount noted in the urine, no leukocytosis, no anemia, sodium noted to be 135, elevated creatinine 1.41.  Troponin 4.  Flu and negative COVID.   Blood glucose noted to be 306, patient is an insulin-dependent diabetic- weekly and oral daily. Patient has not taken today, states that she will take her medication as soon as she goes home, advised to increase her water intake reduce her carbohydrate  and sugar intake.  Patient advised to call PCP tomorrow morning to schedule a follow-up appointment to consider changing the diabetic medications.  Patient verbalizes understanding and agrees with plan.   Patient is an already diagnosed diabetic.VSS patient  remains hemodynamically stable, tolerating p.o. intake without nausea or vomiting.      IMPRESSION:      1.  Acute bronchitis, unspecified organism         2.  Hyperglycemia            DISPOSITION:   Discharged           Discharge Medication List as of 10/26/2020  2:13 PM                 START taking these medications          Details        benzonatate (Tessalon Perles) 100 mg capsule  Take 1 Capsule by mouth three (3) times daily as needed for Cough for up to 7 days., Normal, Disp-30 Capsule, R-0               cetirizine (ZyrTEC) 10 mg tablet  Take 1 Tablet by mouth daily for 14 days., Normal, Disp-14 Tablet, R-0  azelastine (ASTEPRO) 205.5 mcg (0.15 %)  1 Spray by Both Nostrils route two (2) times a day for 14 days., Normal, Disp-1 Each, R-0                        CONTINUE these medications which have NOT CHANGED          Details        albuterol (PROVENTIL HFA, VENTOLIN HFA, PROAIR HFA) 90 mcg/actuation inhaler  inhale 2 puffs by mouth and INTO THE LUNGS every 4 hours if needed for wheezing, Historical Med               !! divalproex DR (Depakote) 125 mg tablet  Depakote, Historical Med               glipiZIDE (GLUCOTROL) 10 mg tablet  glipizide, Historical Med               lisinopriL 1 mg/mL soln  lisinopril, Historical Med               !! metFORMIN (GLUCOPHAGE) 1,000 mg tablet  metformin, Historical Med               !! mirtazapine (Remeron) 15 mg tablet  Remeron, Historical Med               diclofenac (VOLTAREN) 1 % gel  APPLY 1 GRAM TOPICALLY TO AFFECTED AREA 4 TIMES A DAY IF NEEDED, Historical Med               methocarbamoL (ROBAXIN) 750 mg tablet  TAKE 1 TABLET 3 TIMES A DAY FOR 10 DAYS. DO NOT DRIVE OR OPERATE MACHINERY FOR 8 HOURS AFTER TAKING., Historical Med               traZODone (DESYREL) 50 mg tablet  TAKE 1/4 TO 1/2 TABLET BY MOUTH AT BEDTIME, Historical Med               Dexcom G6 Sensor devi  Use as directed every 10 days,  Normal, Disp-9 Each, R-3, DAW               Dexcom G6 Receiver misc  Use as directed, Normal, Disp-1 Each, R-0, DAW               Dexcom G6 Transmitter devi  Use as directed, Normal, Disp-1 Each, R-3, DAW               dulaglutide (Trulicity) 9.38 BO/1.7 mL sub-q pen  Use as directed, Sample, Disp-2 Pen, R-0               dulaglutide (Trulicity) 1.5 PZ/0.2 mL sub-q pen  0.5 mL by SubCUTAneous route every seven (7) days., Sample, Disp-2 Each, R-0               glipiZIDE SR (GLUCOTROL XL) 10 mg CR tablet  2 tablets once a day, Normal, Disp-60 Tablet, R-5               dulaglutide (Trulicity) 3 HE/5.2 mL pnij  3 mg by SubCUTAneous route every seven (7) days., Print, Disp-4 Each, R-5               glucose blood VI test strips (blood glucose test) strip  One touch verio.  Check up to 3 times a day, Normal, Disp-100 Strip, R-5               empagliflozin (Jardiance) 25 mg  tablet  One tablet once a day, No Print, Disp-30 Tablet, R-0               !! metFORMIN (GLUCOPHAGE) 500 mg tablet  1 tablet twice a day, No Print, Disp-60 Tablet, R-0               hydroCHLOROthiazide (MICROZIDE) 12.5 mg capsule  Take 12.5 mg by mouth daily., Historical Med               !! citalopram (CELEXA) 20 mg tablet  Take 20 mg by mouth daily., Historical Med               !! citalopram (CELEXA) 10 mg tablet  Take 10 mg by mouth daily., Historical Med               !! divalproex DR (DEPAKOTE) 125 mg tablet  Take 125 mg by mouth two (2) times a day., Historical Med               !! mirtazapine (REMERON) 45 mg tablet  Take 45 mg by mouth nightly.  , Historical Med               melatonin 5 mg tablet  take 1 tablet by mouth at bedtime for insomnia if needed, Historical Med               lisinopriL (PRINIVIL, ZESTRIL) 10 mg tablet  Take 1 Tablet by mouth daily., Normal, Disp-90 Tablet, R-3               meloxicam (MOBIC) 15 mg tablet  Take 1 Tablet by mouth daily., Normal, Disp-30 Tablet, R-1               !! - Potential duplicate medications found.  Please discuss with provider.                     Follow-up Information      None                   The patient is asked to follow-up with their primary care provider in the next several days.  They are to call tomorrow for an appointment.  The patient is asked to return promptly for any increased concerns or worsening of symptoms.  They can return  to this emergency department or any other emergency department.   Earlene Bjelland Dia Crawford, NP   3:18 PM

## 2020-10-27 LAB — EKG 12-LEAD
Atrial Rate: 85 {beats}/min
Diagnosis: NORMAL
P Axis: 61 degrees
P-R Interval: 140 ms
Q-T Interval: 374 ms
QRS Duration: 72 ms
QTc Calculation (Bazett): 445 ms
R Axis: 73 degrees
T Axis: 54 degrees
Ventricular Rate: 85 {beats}/min

## 2020-10-27 LAB — EKG, 12 LEAD, INITIAL
Atrial Rate: 85 {beats}/min
Calculated P Axis: 61 degrees
Calculated R Axis: 73 degrees
Calculated T Axis: 54 degrees
Diagnosis: NORMAL
P-R Interval: 140 ms
Q-T Interval: 374 ms
QRS Duration: 72 ms
QTC Calculation (Bezet): 445 ms
Ventricular Rate: 85 {beats}/min

## 2021-06-16 NOTE — Telephone Encounter (Signed)
Appointment is required for refills
